# Patient Record
Sex: Female | Born: 1995 | Race: Black or African American | Hispanic: No | Marital: Single | Smoking: Former smoker
Health system: Southern US, Community
[De-identification: ages and names within clinical notes are randomized; demographics above are authoritative.]

---

## 2017-03-25 ENCOUNTER — Encounter (HOSPITAL_COMMUNITY): Payer: Self-pay | Admitting: *Deleted

## 2017-03-25 DIAGNOSIS — Z79899 Other long term (current) drug therapy: Secondary | ICD-10-CM | POA: Insufficient documentation

## 2017-03-25 DIAGNOSIS — R079 Chest pain, unspecified: Secondary | ICD-10-CM | POA: Insufficient documentation

## 2017-03-25 NOTE — ED Triage Notes (Signed)
Pt was at rest about 2 hours ago and she had a "tightness" pain under the left breast and in the center of her chest. She says that she felt like every time she swallowed it was like something was in her throat. Pain lasted about 45 minutes. Pt says she has had the pain before, usually only last 15 minutes then subsides. No meds for the pain.

## 2017-03-26 ENCOUNTER — Emergency Department (HOSPITAL_COMMUNITY)
Admission: EM | Admit: 2017-03-26 | Discharge: 2017-03-26 | Disposition: A | Discharge status: Home / Self Care | Payer: Self-pay | Attending: Emergency Medicine | Admitting: Emergency Medicine

## 2017-03-26 NOTE — ED Notes (Signed)
No response in lobby to go to treatment room

## 2017-03-26 NOTE — ED Notes (Signed)
Called for treatment room, no response 

## 2017-10-04 ENCOUNTER — Emergency Department (HOSPITAL_COMMUNITY)
Admission: EM | Admit: 2017-10-04 | Discharge: 2017-10-04 | Disposition: A | Discharge status: Home / Self Care | Payer: BLUE CROSS/BLUE SHIELD | Attending: Emergency Medicine | Admitting: Emergency Medicine

## 2017-10-04 ENCOUNTER — Encounter (HOSPITAL_COMMUNITY): Payer: Self-pay

## 2017-10-04 ENCOUNTER — Emergency Department (HOSPITAL_COMMUNITY): Payer: BLUE CROSS/BLUE SHIELD

## 2017-10-04 ENCOUNTER — Other Ambulatory Visit: Payer: Self-pay

## 2017-10-04 DIAGNOSIS — M25512 Pain in left shoulder: Secondary | ICD-10-CM | POA: Insufficient documentation

## 2017-10-04 DIAGNOSIS — S00431A Contusion of right ear, initial encounter: Secondary | ICD-10-CM

## 2017-10-04 DIAGNOSIS — Y939 Activity, unspecified: Secondary | ICD-10-CM | POA: Insufficient documentation

## 2017-10-04 DIAGNOSIS — Z23 Encounter for immunization: Secondary | ICD-10-CM | POA: Diagnosis not present

## 2017-10-04 DIAGNOSIS — Z87891 Personal history of nicotine dependence: Secondary | ICD-10-CM | POA: Insufficient documentation

## 2017-10-04 DIAGNOSIS — Y999 Unspecified external cause status: Secondary | ICD-10-CM | POA: Insufficient documentation

## 2017-10-04 DIAGNOSIS — Y92410 Unspecified street and highway as the place of occurrence of the external cause: Secondary | ICD-10-CM | POA: Diagnosis not present

## 2017-10-04 DIAGNOSIS — S0990XA Unspecified injury of head, initial encounter: Secondary | ICD-10-CM | POA: Diagnosis present

## 2017-10-04 LAB — CBC
HCT: 38.9 % (ref 36.0–46.0)
Hemoglobin: 13.8 g/dL (ref 12.0–15.0)
MCH: 28.8 pg (ref 26.0–34.0)
MCHC: 35.5 g/dL (ref 30.0–36.0)
MCV: 81.2 fL (ref 78.0–100.0)
PLATELETS: 249 10*3/uL (ref 150–400)
RBC: 4.79 MIL/uL (ref 3.87–5.11)
RDW: 13 % (ref 11.5–15.5)
WBC: 15.2 10*3/uL — ABNORMAL HIGH (ref 4.0–10.5)

## 2017-10-04 LAB — BASIC METABOLIC PANEL
Anion gap: 12 (ref 5–15)
BUN: 9 mg/dL (ref 6–20)
CHLORIDE: 108 mmol/L (ref 101–111)
CO2: 20 mmol/L — AB (ref 22–32)
Calcium: 9.6 mg/dL (ref 8.9–10.3)
Creatinine, Ser: 0.9 mg/dL (ref 0.44–1.00)
GFR calc Af Amer: >60 mL/min (ref >60)
GFR calc non Af Amer: >60 mL/min (ref >60)
Glucose, Bld: 95 mg/dL (ref 65–99)
Potassium: 3.6 mmol/L (ref 3.5–5.1)
Sodium: 140 mmol/L (ref 135–145)

## 2017-10-04 LAB — I-STAT BETA HCG BLOOD, ED (MC, WL, AP ONLY)

## 2017-10-04 MED ORDER — OXYCODONE-ACETAMINOPHEN 5-325 MG PO TABS
2.0000 | ORAL_TABLET | Freq: Once | ORAL | Status: AC
  Administered 2017-10-04: 2 via ORAL
  Filled 2017-10-04: qty 2

## 2017-10-04 MED ORDER — CYCLOBENZAPRINE HCL 10 MG PO TABS: 10.0000 mg | ORAL_TABLET | Freq: Two times a day (BID) | ORAL | 0 refills | Status: AC | PRN

## 2017-10-04 MED ORDER — TETANUS-DIPHTH-ACELL PERTUSSIS 5-2.5-18.5 LF-MCG/0.5 IM SUSP
0.5000 mL | Freq: Once | INTRAMUSCULAR | Status: AC
  Administered 2017-10-04: 0.5 mL via INTRAMUSCULAR
  Filled 2017-10-04: qty 0.5

## 2017-10-04 MED ORDER — SODIUM CHLORIDE 0.9 % IV BOLUS
1000.0000 mL | Freq: Once | INTRAVENOUS | Status: AC
  Administered 2017-10-04: 1000 mL via INTRAVENOUS

## 2017-10-04 NOTE — Discharge Instructions (Addendum)
Please use shoulder immobilizer as needed for pain.  Call Dr. Debby BudNorins office tomorrow for follow-up Use ibuprofen and Tylenol as needed for pain.
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
## 2017-10-04 NOTE — ED Notes (Signed)
Pt back from CT and can't tell yet if the pain meds have worked
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
## 2017-10-04 NOTE — ED Notes (Signed)
Patient reports that she is very anxious-[id number] in triage.
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
